# Patient Record
Sex: Male | Born: 1935 | Race: White | Hispanic: No | Marital: Married | Smoking: Former smoker
Health system: Southern US, Community
[De-identification: ages and names within clinical notes are randomized; demographics above are authoritative.]

## PROBLEM LIST (undated history)

## (undated) DIAGNOSIS — I10 Essential (primary) hypertension: Secondary | ICD-10-CM

## (undated) DIAGNOSIS — G9001 Carotid sinus syncope: Secondary | ICD-10-CM

## (undated) DIAGNOSIS — E785 Hyperlipidemia, unspecified: Secondary | ICD-10-CM

## (undated) DIAGNOSIS — F0390 Unspecified dementia without behavioral disturbance: Secondary | ICD-10-CM

## (undated) DIAGNOSIS — I495 Sick sinus syndrome: Secondary | ICD-10-CM

## (undated) DIAGNOSIS — Z950 Presence of cardiac pacemaker: Secondary | ICD-10-CM

## (undated) HISTORY — DX: Hyperlipidemia, unspecified: E78.5

## (undated) HISTORY — DX: Sick sinus syndrome: I49.5

## (undated) HISTORY — PX: INGUINAL HERNIA REPAIR: SUR1180

## (undated) HISTORY — DX: Unspecified dementia, unspecified severity, without behavioral disturbance, psychotic disturbance, mood disturbance, and anxiety: F03.90

## (undated) HISTORY — DX: Presence of cardiac pacemaker: Z95.0

## (undated) HISTORY — DX: Carotid sinus syncope: G90.01

## (undated) HISTORY — DX: Essential (primary) hypertension: I10

---

## 2002-01-02 ENCOUNTER — Encounter: Payer: Self-pay | Admitting: Cardiology

## 2002-01-02 ENCOUNTER — Inpatient Hospital Stay (HOSPITAL_COMMUNITY): Admission: EM | Admit: 2002-01-02 | Discharge: 2002-01-04 | Payer: Self-pay | Admitting: Cardiology

## 2002-01-03 HISTORY — PX: PACEMAKER INSERTION: SHX728

## 2002-01-04 ENCOUNTER — Encounter: Payer: Self-pay | Admitting: Internal Medicine

## 2002-01-04 ENCOUNTER — Encounter: Payer: Self-pay | Admitting: Cardiology

## 2002-08-18 ENCOUNTER — Encounter: Payer: Self-pay | Admitting: Cardiology

## 2004-03-14 ENCOUNTER — Ambulatory Visit: Payer: Self-pay

## 2004-09-10 ENCOUNTER — Ambulatory Visit: Payer: Self-pay | Admitting: Internal Medicine

## 2004-12-11 ENCOUNTER — Ambulatory Visit: Payer: Self-pay | Admitting: Cardiology

## 2005-03-13 ENCOUNTER — Ambulatory Visit: Payer: Self-pay | Admitting: Internal Medicine

## 2005-04-16 ENCOUNTER — Ambulatory Visit: Payer: Self-pay | Admitting: Internal Medicine

## 2005-05-19 ENCOUNTER — Ambulatory Visit: Payer: Self-pay | Admitting: Internal Medicine

## 2005-06-19 ENCOUNTER — Ambulatory Visit: Payer: Self-pay | Admitting: Internal Medicine

## 2005-07-22 ENCOUNTER — Ambulatory Visit: Payer: Self-pay | Admitting: Internal Medicine

## 2005-09-01 ENCOUNTER — Ambulatory Visit: Payer: Self-pay | Admitting: Internal Medicine

## 2005-10-05 ENCOUNTER — Ambulatory Visit: Payer: Self-pay | Admitting: Internal Medicine

## 2005-11-10 ENCOUNTER — Ambulatory Visit: Payer: Self-pay | Admitting: Internal Medicine

## 2005-12-04 ENCOUNTER — Ambulatory Visit: Payer: Self-pay | Admitting: Internal Medicine

## 2006-02-11 ENCOUNTER — Ambulatory Visit: Payer: Self-pay | Admitting: Internal Medicine

## 2006-04-08 ENCOUNTER — Ambulatory Visit: Payer: Self-pay | Admitting: Internal Medicine

## 2006-05-06 ENCOUNTER — Ambulatory Visit: Payer: Self-pay | Admitting: Internal Medicine

## 2006-06-03 ENCOUNTER — Ambulatory Visit: Payer: Self-pay | Admitting: Internal Medicine

## 2006-07-01 ENCOUNTER — Ambulatory Visit: Payer: Self-pay | Admitting: Internal Medicine

## 2006-07-29 ENCOUNTER — Ambulatory Visit: Payer: Self-pay | Admitting: Internal Medicine

## 2006-08-26 ENCOUNTER — Ambulatory Visit: Payer: Self-pay | Admitting: Internal Medicine

## 2006-09-23 ENCOUNTER — Ambulatory Visit: Payer: Self-pay | Admitting: Internal Medicine

## 2006-10-21 ENCOUNTER — Ambulatory Visit: Payer: Self-pay | Admitting: Internal Medicine

## 2006-11-18 ENCOUNTER — Ambulatory Visit: Payer: Self-pay | Admitting: Internal Medicine

## 2006-12-16 ENCOUNTER — Ambulatory Visit: Payer: Self-pay | Admitting: Internal Medicine

## 2007-01-13 ENCOUNTER — Ambulatory Visit: Payer: Self-pay | Admitting: Internal Medicine

## 2007-01-31 ENCOUNTER — Ambulatory Visit: Payer: Self-pay | Admitting: Internal Medicine

## 2007-02-10 ENCOUNTER — Ambulatory Visit: Payer: Self-pay | Admitting: Internal Medicine

## 2007-03-10 ENCOUNTER — Ambulatory Visit: Payer: Self-pay | Admitting: Internal Medicine

## 2007-04-07 ENCOUNTER — Ambulatory Visit: Payer: Self-pay | Admitting: Internal Medicine

## 2007-05-05 ENCOUNTER — Ambulatory Visit: Payer: Self-pay | Admitting: Internal Medicine

## 2007-08-04 ENCOUNTER — Ambulatory Visit: Payer: Self-pay | Admitting: Internal Medicine

## 2007-10-06 ENCOUNTER — Ambulatory Visit: Payer: Self-pay | Admitting: Internal Medicine

## 2007-11-03 ENCOUNTER — Ambulatory Visit: Payer: Self-pay | Admitting: Internal Medicine

## 2008-02-02 ENCOUNTER — Ambulatory Visit: Payer: Self-pay | Admitting: Internal Medicine

## 2008-05-03 ENCOUNTER — Ambulatory Visit: Payer: Self-pay | Admitting: Internal Medicine

## 2008-05-11 ENCOUNTER — Ambulatory Visit: Payer: Self-pay | Admitting: Internal Medicine

## 2008-05-14 ENCOUNTER — Encounter: Payer: Self-pay | Admitting: Internal Medicine

## 2008-09-03 ENCOUNTER — Ambulatory Visit: Payer: Self-pay | Admitting: Internal Medicine

## 2008-12-03 ENCOUNTER — Encounter: Payer: Self-pay | Admitting: Internal Medicine

## 2009-01-04 ENCOUNTER — Ambulatory Visit: Payer: Self-pay | Admitting: Internal Medicine

## 2009-01-07 DIAGNOSIS — G9001 Carotid sinus syncope: Secondary | ICD-10-CM

## 2009-01-07 DIAGNOSIS — R55 Syncope and collapse: Secondary | ICD-10-CM

## 2009-03-04 ENCOUNTER — Ambulatory Visit: Payer: Self-pay | Admitting: Internal Medicine

## 2009-04-10 ENCOUNTER — Encounter: Payer: Self-pay | Admitting: Cardiology

## 2009-04-16 ENCOUNTER — Ambulatory Visit: Payer: Self-pay | Admitting: Cardiology

## 2009-04-16 ENCOUNTER — Encounter: Payer: Self-pay | Admitting: Cardiology

## 2009-04-29 ENCOUNTER — Ambulatory Visit: Payer: Self-pay | Admitting: Cardiology

## 2009-04-29 ENCOUNTER — Encounter (INDEPENDENT_AMBULATORY_CARE_PROVIDER_SITE_OTHER): Payer: Self-pay | Admitting: *Deleted

## 2009-04-29 DIAGNOSIS — R9439 Abnormal result of other cardiovascular function study: Secondary | ICD-10-CM

## 2009-04-30 ENCOUNTER — Encounter: Payer: Self-pay | Admitting: Cardiology

## 2009-05-03 ENCOUNTER — Ambulatory Visit: Payer: Self-pay | Admitting: Cardiology

## 2009-05-03 ENCOUNTER — Inpatient Hospital Stay (HOSPITAL_BASED_OUTPATIENT_CLINIC_OR_DEPARTMENT_OTHER): Admission: RE | Admit: 2009-05-03 | Discharge: 2009-05-03 | Payer: Self-pay | Admitting: Cardiology

## 2009-05-16 ENCOUNTER — Ambulatory Visit: Payer: Self-pay | Admitting: Cardiology

## 2009-06-03 ENCOUNTER — Ambulatory Visit: Payer: Self-pay | Admitting: Internal Medicine

## 2009-09-13 ENCOUNTER — Ambulatory Visit: Payer: Self-pay | Admitting: Internal Medicine

## 2009-10-04 ENCOUNTER — Ambulatory Visit: Payer: Self-pay | Admitting: Internal Medicine

## 2010-01-03 ENCOUNTER — Ambulatory Visit: Payer: Self-pay | Admitting: Internal Medicine

## 2010-02-14 ENCOUNTER — Encounter (INDEPENDENT_AMBULATORY_CARE_PROVIDER_SITE_OTHER): Payer: Self-pay | Admitting: *Deleted

## 2010-02-26 ENCOUNTER — Encounter: Payer: Self-pay | Admitting: Internal Medicine

## 2010-04-04 ENCOUNTER — Ambulatory Visit: Payer: Self-pay | Admitting: Internal Medicine

## 2010-04-04 ENCOUNTER — Encounter: Payer: Self-pay | Admitting: Internal Medicine

## 2010-05-12 ENCOUNTER — Ambulatory Visit
Admission: RE | Admit: 2010-05-12 | Discharge: 2010-05-12 | Payer: Self-pay | Source: Home / Self Care | Attending: Internal Medicine | Admitting: Internal Medicine

## 2010-05-12 ENCOUNTER — Encounter: Payer: Self-pay | Admitting: Internal Medicine

## 2010-05-20 NOTE — Assessment & Plan Note (Signed)
Summary: f/u PPM/reminder received LA   Visit Type:  Pacemaker check Primary Provider:  Dr. Donzetta Sprung   History of Present Illness: The patient presents today for routine electrophysiology followup. He reports doing very well since last being seen in our clinic. 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  He has had progressive sinus node dysfunction and now required frequent atrial pacing.  The patient denies symptoms of palpitations, chest pain, shortness of breath, orthopnea, PND, lower extremity edema, dizziness, presyncope, syncope, or neurologic sequela. The patient is tolerating medications without difficulties and is otherwise without complaint today.   Preventive Screening-Counseling & Management  Alcohol-Tobacco     Smoking Status: quit     Year Quit: 1985  Current Medications (verified): 1)  Namenda 10 Mg Tabs (Memantine Hcl) .... Take 1 Tablet By Mouth Two Times A Day 2)  Aricept 10 Mg Tabs (Donepezil Hcl) .... Take 1 Tablet By Mouth Once A Day 3)  Colchicine 0.6 Mg Tabs (Colchicine) .... Take 1 Tablet By Mouth Two Times A Day 4)  Atenolol 25 Mg Tabs (Atenolol) .... Take 1 Tablet By Mouth Once A Day 5)  Simvastatin 40 Mg Tabs (Simvastatin) .... 1/2 Tab At Bedtime 6)  Terazosin Hcl 10 Mg Caps (Terazosin Hcl) .... Take 1 Tablet By Mouth Once A Day 7)  Hydrochlorothiazide 25 Mg Tabs (Hydrochlorothiazide) .... Take One Tablet By Mouth Daily. 8)  Aspir-Low 81 Mg Tbec (Aspirin) .... Take 1 Tablet By Mouth Once A Day  Allergies (verified): No Known Drug Allergies  Comments:  Nurse/Medical Assistant: The patient's medications and allergies were reviewed with the patient and were updated in the Medication and Allergy Lists. List reviewed.  Past History:  Past Medical History: SINOATRIAL NODE DYSFUNCTION (ICD-427.81) PACEMAKER (ICD-V45.Marland Kitchen01) CAROTID SINUS SYNDROME (ICD-337.01)  Past Surgical History: Reviewed history from 04/29/2009  and no changes required. Pacemaker 01/03/02  Medtronic Kappa [id number] Right inguinal hernia repair Nephrolithiasis  Social History: Reviewed history from 04/29/2009 and no changes required. Married  Alcohol Use - no Drug Use - no Quit tobacco 1950sSmoking Status:  quit  Vital Signs:  Patient profile:   77 year old male Height:      69 inches Weight:      179 pounds Pulse rate:   83 / minute BP sitting:   123 / 80  (left arm) Cuff size:   regular  Vitals Entered By: Carlye Grippe (Sep 13, 2009 3:50 PM)  Physical Exam  General:  Well developed, well nourished, in no acute distress. Head:  normocephalic and atraumatic Eyes:  PERRLA/EOM intact; conjunctiva and lids normal. Mouth:  Teeth, gums and palate normal. Oral mucosa normal. Neck:  supple Chest Wall:  R sided pacemaker pocket is well healed Lungs:  Clear bilaterally to auscultation and percussion. Heart:  Non-displaced PMI, chest non-tender; regular rate and rhythm, S1, S2 without murmurs, rubs or gallops. Carotid upstroke normal, no bruit. Normal abdominal aortic size, no bruits. Femorals normal pulses, no bruits. Pedals normal pulses. No edema, no varicosities. Abdomen:  Bowel sounds positive; abdomen soft and non-tender without masses, organomegaly, or hernias noted. No hepatosplenomegaly. Msk:  Back normal, normal gait. Muscle strength and tone normal. Pulses:  pulses normal in all 4 extremities Extremities:  No clubbing or cyanosis. Neurologic:  Alert and oriented x 3.   PPM Specifications Following MD:  Hillis Range, MD     PPM Vendor:  Medtronic     PPM Model Number:  [id number]  PPM Serial Number:  [id number] PPM DOI:  01/03/2002     PPM Implanting MD:  Sherryl Manges, MD  Lead 1    Location: RA     DOI: 01/03/2002     Model #: [id number]     Serial #: [id number]     Status: active Lead 2    Location: RV     DOI: 01/03/2002     Model #: [id number]     Serial #: [id number]     Status: active  Magnet Response Rate:  BOL 85 ERI  65  Indications:  SYNCOPE WITH BRADYCARDIA   PPM Follow Up Remote Check?  No Battery Voltage:  2.67 V     Battery Est. Longevity:  15 MONTHS     Pacer Dependent:  Yes       PPM Device Measurements Atrium  Amplitude: PACED AT 30 mV, Impedance: 497 ohms, Threshold: 1.0 V at 0.4 msec Right Ventricle  Amplitude: 11.2 mV, Impedance: 597 ohms, Threshold: 1.5 V at 0.4 msec  Episodes MS Episodes:  0     Ventricular High Rate:  0     Atrial Pacing:  99.4%     Ventricular Pacing:  5.9%  Parameters Mode:  DDDR     Lower Rate Limit:  60     Upper Rate Limit:  130 Paced AV Delay:  300     Sensed AV Delay:  270 Next Cardiology Appt Due:  02/18/2010 Tech Comments:  Normal device function.  No changes made today.  Will change TTM's to monthly. ROV 6 months clinic. Gypsy Balsam RN BSN  Sep 13, 2009 4:06 PM  MD Comments:  agree  Impression & Recommendations:  Problem # 1:  SINOATRIAL NODE DYSFUNCTION (ICD-427.81) normal pacemaker function will follow with monthly TTMs as ERI is approaching return to clinic in 6 months

## 2010-05-20 NOTE — Assessment & Plan Note (Signed)
Summary: NP6-CHEST TIGHTNESS  ABNORMAL CARD/STRESS TEST   Visit Type:  Initial Consult Primary Provider:  Dr. Reuel Boom   History of Present Illness: The pateint presents for evaluation of chest pain, dyspnea and fatigue.  He has a memory disorder which makes this evaluation slightly more difficult. However, his wife and daughter fill in the details. They report that a few weeks ago he had chest discomfort while walking at church. He felt somewhat diaphoretic. He has had over the past several weeks increasing fatigue and decreased exercise tolerance. He does rub his chest but it is near a previous pacemaker. He does not verbalize for himself significant chest pressure neck ordered arm discomfort. However, there's been a clear decline in his functional status secondary to fatigue, dyspnea with exertion and vague chest discomfort. He's also had some dizziness. He is functional and very pleasantly confused. He did have a stress perfusion study. He was unable to reach his target heart rate and was converted to a dobutamine perfusion study. His ejection fraction was 54% with inferior scar and mild ischemia.  Preventive Screening-Counseling & Management  Alcohol-Tobacco     Smoking Status: quit     Year Quit: 30 - 40 years  Current Medications (verified): 1)  Namenda 10 Mg Tabs (Memantine Hcl) .... Take 1 Tablet By Mouth Two Times A Day 2)  Aricept 10 Mg Tabs (Donepezil Hcl) .... Take 1 Tablet By Mouth Once A Day 3)  Ranitidine Hcl 150 Mg Caps (Ranitidine Hcl) .... Take 1 Tablet By Mouth Two Times A Day 4)  Colchicine 0.6 Mg Tabs (Colchicine) .... Take 1 Tablet By Mouth Two Times A Day 5)  Atenolol 25 Mg Tabs (Atenolol) .... Take 1 Tablet By Mouth Once A Day 6)  Simvastatin 40 Mg Tabs (Simvastatin) .... 1/2 Tab At Bedtime 7)  Terazosin Hcl 10 Mg Caps (Terazosin Hcl) .... Take 1 Tablet By Mouth Once A Day 8)  Hydrochlorothiazide 25 Mg Tabs (Hydrochlorothiazide) .... Take One Tablet By Mouth Daily. 9)   Aspir-Low 81 Mg Tbec (Aspirin) .... Take 1 Tablet By Mouth Once A Day  Allergies (verified): No Known Drug Allergies  Past History:  Past Medical History: SINOATRIAL NODE DYSFUNCTION (ICD-427.81) PACEMAKER (ICD-V45.Marland Kitchen01) SYNCOPE AND COLLAPSE (ICD-780.2) CAROTID SINUS SYNDROME (ICD-337.01)  Past Surgical History: Pacemaker 01/03/02  Medtronic Kappa [id number] Right inguinal hernia repair Nephrolithiasis  Family History: Father with MI age 64  Social History: Married  Alcohol Use - no Drug Use - no Quit tobacco 1950sSmoking Status:  quit  Review of Systems       As stated in the HPI and negative for all other systems.   Vital Signs:  Patient profile:   77 year old male Height:      70 inches Weight:      178 pounds BMI:     25.63 Pulse rate:   62 / minute BP sitting:   117 / 72  (left arm) Cuff size:   regular  Vitals Entered By: Hoover Brunette, LPN (April 29, 2009 2:03 PM)  Nutrition Counseling: Patient's BMI is greater than 25 and therefore counseled on weight management options. Is Patient Diabetic? No Comments chest tightness, abnormal stress test   Physical Exam  General:  Well developed, well nourished, in no acute distress. Head:  normocephalic and atraumatic Eyes:  PERRLA/EOM intact; conjunctiva and lids normal. Mouth:  edentulous, gums and palate normal. Oral mucosa normal. Neck:  Neck supple, no JVD. No masses, thyromegaly or abnormal cervical nodes. Chest Wall:  well-healed  sternotomy scar Lungs:  Clear bilaterally to auscultation and percussion. Abdomen:  Bowel sounds positive; abdomen soft and non-tender without masses, organomegaly, or hernias noted. No hepatosplenomegaly. Msk:  Back normal, normal gait. Muscle strength and tone normal. Extremities:  No clubbing or cyanosis. Neurologic:  Alert and oriented x 3. Skin:  Intact without lesions or rashes. Cervical Nodes:  no significant adenopathy Axillary Nodes:  no significant adenopathy Inguinal  Nodes:  no significant adenopathy Psych:  Normal affect.   Detailed Cardiovascular Exam  Neck    Carotids: Carotids full and equal bilaterally without bruits.      Neck Veins: Normal, no JVD.    Heart    Inspection: no deformities or lifts noted.      Palpation: normal PMI with no thrills palpable.      Auscultation: regular rate and rhythm, S1, S2 without murmurs, rubs, gallops, or clicks.    Vascular    Abdominal Aorta: no palpable masses, pulsations, or audible bruits.      Femoral Pulses: normal femoral pulses bilaterally.      Pedal Pulses: normal pedal pulses bilaterally.      Radial Pulses: normal radial pulses bilaterally.      Peripheral Circulation: no clubbing, cyanosis, or edema noted with normal capillary refill.     EKG  Procedure date:  04/16/2009  Findings:      sinus rhythm, right axis deviation, RSR prime V1, nonspecific T-wave flattening, inferior lead V3 T wave inversion  PPM Specifications Following MD:  Sherryl Manges, MD     PPM Vendor:  Medtronic     PPM Model Number:  [id number]     PPM Serial Number:  [id number] PPM DOI:  01/03/2002     PPM Implanting MD:  Sherryl Manges, MD  Lead 1    Location: RA     DOI: 01/03/2002     Model #: [id number]     Serial #: [id number]     Status: active Lead 2    Location: RV     DOI: 01/03/2002     Model #: [id number]     Serial #: [id number]     Status: active  Magnet Response Rate:  BOL 85 ERI 65  Indications:  SYNCOPE WITH BRADYCARDIA   PPM Follow Up Pacer Dependent:  Yes      Parameters Mode:  DDDR     Lower Rate Limit:  60     Upper Rate Limit:  130 Paced AV Delay:  300     Sensed AV Delay:  270  Impression & Recommendations:  Problem # 1:  MYOCARDIAL PERFUSION SCAN, WITH STRESS TEST, ABNORMAL (ICD-794.39) The patient had an abnormal stress test as described. He has symptoms that could be progressive angina (unstable). Therefore, cardiac catheterization is indicated. I discussed with the patient the risks benefits  including stroke, heart attack, death, bleeding, bruising, vascular trauma, emboli and other. His family clearly understands and agrees to proceed. He has no objection.  Problem # 2:  PACEMAKER (ICD-V45.Marland Kitchen01) He his probably about due for pacemaker checkup. I will see when this is due and arrange this as needed.  Problem # 3:  DIZZINESS (ICD-780.4) The patient will have evaluation as above. I will also check orthostatic readings though it does not sound like this is the cause.  Other Orders: Cardiac Catheterization (Cardiac Cath) T-Basic Metabolic Panel [phone]) T-CBC No Diff ([id number]) T-Protime, Auto ([id number]) T-PTT ([id number]) T-Chest x-ray, 2 views (18841)  Patient Instructions: 1)  Your physician has requested that you  have a cardiac catheterization.  Cardiac catheterization is used to diagnose and/or treat various heart conditions. Doctors may recommend this procedure for a number of different reasons. The most common reason is to evaluate chest pain. Chest pain can be a symptom of coronary artery disease (CAD), and cardiac catheterization can show whether plaque is narrowing or blocking your heart's arteries. This procedure is also used to evaluate the valves, as well as measure the blood flow and oxygen levels in different parts of your heart.  For further information please visit [email or web address]  Please follow instruction sheet, as given.

## 2010-05-20 NOTE — Cardiovascular Report (Signed)
Summary: TTM   TTM   Imported By: Roderic Ovens 01/29/2010 16:12:06  _____________________________________________________________________  External Attachment:    Type:   Image     Comment:   External Document

## 2010-05-20 NOTE — Cardiovascular Report (Signed)
Summary: TTM   TTM   Imported By: Roderic Ovens 06/20/2009 16:08:42  _____________________________________________________________________  External Attachment:    Type:   Image     Comment:   External Document

## 2010-05-20 NOTE — Letter (Signed)
Summary: Internal Other/ PATIENT HISTORY FORM  Internal Other/ PATIENT HISTORY FORM   Imported By: Dorise Hiss 04/29/2009 15:56:32  _____________________________________________________________________  External Attachment:    Type:   Image     Comment:   External Document

## 2010-05-20 NOTE — Letter (Signed)
Summary: Discharge Summary  Discharge Summary   Imported By: Dorise Hiss 04/29/2009 15:58:56  _____________________________________________________________________  External Attachment:    Type:   Image     Comment:   External Document

## 2010-05-20 NOTE — Cardiovascular Report (Signed)
Summary: TTM   TTM   Imported By: Roderic Ovens 10/28/2009 14:52:34  _____________________________________________________________________  External Attachment:    Type:   Image     Comment:   External Document

## 2010-05-20 NOTE — Letter (Signed)
Summary: External Correspondence/ OFFICE VISIT DR. DANIEL  External Correspondence/ OFFICE VISIT DR. DANIEL   Imported By: Dorise Hiss 04/29/2009 10:51:14  _____________________________________________________________________  External Attachment:    Type:   Image     Comment:   External Document

## 2010-05-20 NOTE — Miscellaneous (Signed)
Summary: dx correction  Clinical Lists Changes  Problems: Changed problem from PACEMAKER (ICD-V45..01) to PACEMAKER, PERMANENT (ICD-V45.01)  changed the incorrect dx code to correct dx code 

## 2010-05-20 NOTE — Letter (Signed)
Summary: External Correspondence/ FAXED PRE-CATH ORDER  External Correspondence/ FAXED PRE-CATH ORDER   Imported By: Dorise Hiss 04/30/2009 14:27:09  _____________________________________________________________________  External Attachment:    Type:   Image     Comment:   External Document

## 2010-05-20 NOTE — Assessment & Plan Note (Signed)
Summary: EPH-POST CATH -S[facility]   Visit Type:  Follow-up Primary Provider:  Dr. Donzetta Sprung  CC:  Chest pain/dyspnea.  History of Present Illness: The patient presents for evaluation of the above. Because of the slightly abnormal stress test and these complaints I did perform a cardiac catheterization. This demonstrated a well preserved ejection fraction and minimal coronary plaque. Since that time he has had no new complaints. He has had no shortness of breath, PND or orthopnea there is has been no further chest discomfort. He had no problems with his catheterization site.  Preventive Screening-Counseling & Management  Alcohol-Tobacco     Smoking Status: quit > 6 months  Comments: Pt smoked for about 10 yrs and quit about 30 yrs.  Current Medications (verified): 1)  Namenda 10 Mg Tabs (Memantine Hcl) .... Take 1 Tablet By Mouth Two Times A Day 2)  Aricept 10 Mg Tabs (Donepezil Hcl) .... Take 1 Tablet By Mouth Once A Day 3)  Ranitidine Hcl 150 Mg Caps (Ranitidine Hcl) .... Take 1 Tablet By Mouth Two Times A Day 4)  Colchicine 0.6 Mg Tabs (Colchicine) .... Take 1 Tablet By Mouth Two Times A Day 5)  Atenolol 25 Mg Tabs (Atenolol) .... Take 1 Tablet By Mouth Once A Day 6)  Simvastatin 40 Mg Tabs (Simvastatin) .... 1/2 Tab At Bedtime 7)  Terazosin Hcl 10 Mg Caps (Terazosin Hcl) .... Take 1 Tablet By Mouth Once A Day 8)  Hydrochlorothiazide 25 Mg Tabs (Hydrochlorothiazide) .... Take One Tablet By Mouth Daily. 9)  Aspir-Low 81 Mg Tbec (Aspirin) .... Take 1 Tablet By Mouth Once A Day  Allergies: No Known Drug Allergies  Comments:  Nurse/Medical Assistant: The patient's medications were reviewed with the patient and were updated in the Medication List. Pt brought a list of medications to office visit.  Cyril Loosen, RN, BSN (May 16, 2009 11:01 AM)  Past History:  Past Medical History: Reviewed history from 04/29/2009 and no changes required. SINOATRIAL NODE DYSFUNCTION  (ICD-427.81) PACEMAKER (ICD-V45.Marland Kitchen01) SYNCOPE AND COLLAPSE (ICD-780.2) CAROTID SINUS SYNDROME (ICD-337.01)  Past Surgical History: Reviewed history from 04/29/2009 and no changes required. Pacemaker 01/03/02  Medtronic Kappa [id number] Right inguinal hernia repair Nephrolithiasis  Social History: Smoking Status:  quit > 6 months  Review of Systems       As stated in the HPI and negative for all other systems.  Vital Signs:  Patient profile:   77 year old male Height:      69 inches Weight:      174.75 pounds Pulse rate:   68 / minute BP sitting:   132 / 75  (left arm) Cuff size:   regular  Vitals Entered By: Cyril Loosen, RN, BSN (May 16, 2009 10:56 AM) CC: Chest pain/dyspnea Comments Post cath follow up. No problems.   Physical Exam  General:  Well developed, well nourished, in no acute distress. Head:  normocephalic and atraumatic Eyes:  PERRLA/EOM intact; conjunctiva and lids normal. Neck:  Neck supple, no JVD. No masses, thyromegaly or abnormal cervical nodes. Chest Wall:  no deformities or breast masses noted, well healed right upper pacemaker pocket Lungs:  Clear bilaterally to auscultation and percussion. Heart:  Non-displaced PMI, chest non-tender; regular rate and rhythm, S1, S2 without murmurs, rubs or gallops. Carotid upstroke normal, no bruit. Normal abdominal aortic size, no bruits. Femorals normal pulses, no bruits. Pedals normal pulses. No edema, no varicosities. Abdomen:  Bowel sounds positive; abdomen soft and non-tender without masses, organomegaly, or hernias noted. No hepatosplenomegaly.  Msk:  Back normal, normal gait. Muscle strength and tone normal. Extremities:  No clubbing or cyanosis. Neurologic:  Alert and oriented x 3. Skin:  Intact without lesions or rashes. Psych:  Normal affect. (poor short-term memory)   PPM Specifications Following MD:  Sherryl Manges, MD     PPM Vendor:  Medtronic     PPM Model Number:  [phone]     PPM Serial Number:   [id number] PPM DOI:  01/03/2002     PPM Implanting MD:  Sherryl Manges, MD  Lead 1    Location: RA     DOI: 01/03/2002     Model #: [id number]     Serial #: [id number]     Status: active Lead 2    Location: RV     DOI: 01/03/2002     Model #: [id number]     Serial #: [id number]     Status: active  Magnet Response Rate:  BOL 85 ERI 65  Indications:  SYNCOPE WITH BRADYCARDIA   PPM Follow Up Pacer Dependent:  Yes      Parameters Mode:  DDDR     Lower Rate Limit:  60     Upper Rate Limit:  130 Paced AV Delay:  300     Sensed AV Delay:  270  Impression & Recommendations:  Problem # 1:  DYSPNEA (ICD-786.05) The patient was predominantly describing some fatigue with some mild dyspnea and chest discomfort. However, given the results of the catheterization there is no clear cardiac etiology to this. No further workup is suggested  Problem # 2:  SINOATRIAL NODE DYSFUNCTION (ICD-427.81) He is status post pacemaker and will continue followup with our EP clinic.  Patient Instructions: 1)  Your physician recommends that you continue on your current medications as directed. Please refer to the Current Medication list given to you today. 2)  Keep follow up with EP. Reminder confirmed for March/2011.

## 2010-05-20 NOTE — Letter (Signed)
Summary: Cardiac Cath Instructions - [facility]  [facility] at [facility] [address]   [place], [place] [address]   Phone: [phone]  Fax: [phone]     04/29/2009 MRN: [id number]  Darin Thompson [address] [place], [place]  [address]  Dear Darin Thompson,   You are scheduled for a Cardiac Catheterization on Friday 05/03/09 @1 :30pm with Dr.Hochrein.  Please arrive to the 1st floor of the Heart and Vascular Center at [facility] at 12:30pm on the day of your procedure. Please do not arrive before 6:30 a.m. Call the Heart and Vascular Center at [phone] if you are unable to make your appointmnet. The Code to get into the parking garage under the building is 0090. Take the elevators to the 1st floor. You must have someone to drive you home. Someone must be with you for the first 24 hours after you arrive home. Please wear clothes that are easy to get on and off and wear slip-on shoes. Do not eat or drink after midnight except water with your medications that morning. Bring all your medications and current insurance cards with you.  ___ DO NOT take these medications before your procedure:  _X__ Make sure you take your aspirin 325mg .  _X__ You may take ALL of your medications with water that morning.  ___ DO NOT take ANY medications before your procedure.  ___ Pre-med instructions:  ________________________________________________________________________  The usual length of stay after your procedure is 2 to 3 hours. This can vary.  If you have any questions, please call the office at the number listed above.  Carlye Grippe    Directions to the [employer] and Vascular Center [facility]  Please Note : Park in [place] under the building not the parking deck.  From [employer]: Turn onto [address] Left onto [place] (1st stoplight) Right at the brick entrance to the hospital ([address]) Bear to the right and you will  see a blue sign "Heart and Vascular Center" Parking garage is a sharp right'to get through the gate out in the code __0900_____. Once you park, take the elevator to the first floor. Please do not arrive before 0630am. The building will be dark before that time.   From [address] Turn onto [address] Turn left into the brick entrance to the hospital ([address] circle [address]) Bear to the right and you will see a blue sign "[facility] Center" Parking garage is a sharp right, to get thru the gate put in the code _0900___. Once you park, take the elevator to the first floor. Please do not arrive before 0630am. The building will be dark before that time

## 2010-05-20 NOTE — Cardiovascular Report (Signed)
Summary: Card Device Clinic/ INTERROGATION REPORT  Card Device Clinic/ INTERROGATION REPORT   Imported By: Dorise Hiss 09/19/2009 11:14:45  _____________________________________________________________________  External Attachment:    Type:   Image     Comment:   External Document

## 2010-05-22 NOTE — Assessment & Plan Note (Signed)
Summary: 6 MO FU PER NOV REMINDER-SRS   Visit Type:  Pacemaker check Primary Provider:  Dr. Donzetta Sprung   History of Present Illness: The patient presents today for routine electrophysiology followup. He reports doing very well since last being seen in our clinic.  He has had a dry cough and wheeze for several days but denies fevers, chills, or SOB.  The patient denies symptoms of palpitations, chest pain,  orthopnea, PND, lower extremity edema, dizziness, presyncope, syncope, or neurologic sequela. The patient is tolerating medications without difficulties and is otherwise without complaint today.   Preventive Screening-Counseling & Management  Alcohol-Tobacco     Smoking Status: quit  Current Medications (verified): 1)  Namenda 10 Mg Tabs (Memantine Hcl) .... Take 1 Tablet By Mouth Two Times A Day 2)  Aricept 10 Mg Tabs (Donepezil Hcl) .... Take 1 Tablet By Mouth Once A Day 3)  Colchicine 0.6 Mg Tabs (Colchicine) .... Take 1 Tablet By Mouth Two Times A Day 4)  Atenolol 25 Mg Tabs (Atenolol) .... Take 1 Tablet By Mouth Once A Day 5)  Simvastatin 40 Mg Tabs (Simvastatin) .... 1/2 Tab At Bedtime 6)  Terazosin Hcl 10 Mg Caps (Terazosin Hcl) .... Take 1 Tablet By Mouth Once A Day 7)  Hydrochlorothiazide 25 Mg Tabs (Hydrochlorothiazide) .... Take One Tablet By Mouth Daily. 8)  Aspir-Low 81 Mg Tbec (Aspirin) .... Take 1 Tablet By Mouth Once A Day  Allergies (verified): No Known Drug Allergies  Comments:  Nurse/Medical Assistant: The patient's medications and allergies were reviewed with the patient and were updated in the Medication and Allergy Lists. Tammi Romine CMA (May 12, 2010 3:22 PM)  Past History:  Past Medical History: SINOATRIAL NODE DYSFUNCTION (ICD-427.81) PACEMAKER (ICD-V45.Marland Kitchen01) CAROTID SINUS SYNDROME (ICD-337.01) Dementia Gout HL HTN  Past Surgical History: Reviewed history from 04/29/2009 and no changes required. Pacemaker 01/03/02  Medtronic Kappa  [id number] Right inguinal hernia repair Nephrolithiasis  Social History: Reviewed history from 04/29/2009 and no changes required. Married  Alcohol Use - no Drug Use - no Quit tobacco 1950s  Vital Signs:  Patient profile:   77 year old male Height:      69 inches Weight:      177 pounds BMI:     26.23 Pulse rate:   61 / minute BP sitting:   114 / 73  (right arm) Cuff size:   regular  Vitals Entered By: Fuller Plan CMA (May 12, 2010 3:22 PM)  Physical Exam  General:  Well developed, well nourished, in no acute distress. Head:  normocephalic and atraumatic Eyes:  PERRLA/EOM intact; conjunctiva and lids normal. Mouth:  Teeth, gums and palate normal. Oral mucosa normal. Neck:  supple Chest Wall:  R sided PPM well healed Lungs:  diffuse expiratory wheezes, nl WOB Heart:  RRR (paced), no m/r/g Abdomen:  Bowel sounds positive; abdomen soft and non-tender without masses, organomegaly, or hernias noted. No hepatosplenomegaly. Msk:  Back normal, normal gait. Muscle strength and tone normal. Extremities:  No clubbing or cyanosis. Neurologic:  Alert and oriented x 3. Skin:  Intact without lesions or rashes.   PPM Specifications Following MD:  Hillis Range, MD     PPM Vendor:  Medtronic     PPM Model Number:  [id number]     PPM Serial Number:  [id number] PPM DOI:  01/03/2002     PPM Implanting MD:  Sherryl Manges, MD  Lead 1    Location: RA     DOI: 01/03/2002     Model #:  [id number]     Serial #: [id number]     Status: active Lead 2    Location: RV     DOI: 01/03/2002     Model #: [id number]     Serial #: [id number]     Status: active  Magnet Response Rate:  BOL 85 ERI 65  Indications:  SYNCOPE WITH BRADYCARDIA   PPM Follow Up Battery Voltage:  2.58 V     Battery Est. Longevity:  3 mths     Pacer Dependent:  Yes       PPM Device Measurements Atrium  Amplitude: PACED mV, Impedance: 527 ohms, Threshold: 0.750 V at 0.40 msec Right Ventricle  Amplitude: 8.00 mV, Impedance: 592 ohms,  Threshold: 1.750 V at 0.40 msec  Episodes MS Episodes:  0     Ventricular High Rate:  0     Atrial Pacing:  98.6%     Ventricular Pacing:  97.5%  Parameters Mode:  DDDR     Lower Rate Limit:  60     Upper Rate Limit:  130 Paced AV Delay:  300     Sensed AV Delay:  270 Tech Comments:  BATTERY LONGEVITY 3 MTHS---MONTHLY TTMs THRU MEDNET.  NORMAL DEVICE FUNCTION.  RV THRESHOLD 1.750 @ 0.40 --INCREASED RV OUTPUT FROM 2.750 TO 3.5 V.  Vella Kohler  May 12, 2010 3:18 PM MD Comments:  agree  Impression & Recommendations:  Problem # 1:  SINOATRIAL NODE DYSFUNCTION (ICD-427.81) normal pacemaker function as above approaching ERI monthly TTMs, return in 3 months

## 2010-05-22 NOTE — Cardiovascular Report (Signed)
Summary: TTM   TTM   Imported By: Roderic Ovens 04/25/2010 14:20:37  _____________________________________________________________________  External Attachment:    Type:   Image     Comment:   External Document

## 2010-05-28 NOTE — Cardiovascular Report (Signed)
Summary: Card Device Clinic/ FINAL REPORT  Card Device Clinic/ FINAL REPORT   Imported By: Dorise Hiss 05/22/2010 16:52:00  _____________________________________________________________________  External Attachment:    Type:   Image     Comment:   External Document

## 2010-06-05 NOTE — Cardiovascular Report (Signed)
Summary: AICD Data Sheet   AICD Data Sheet   Imported By: Roderic Ovens 05/29/2010 09:55:02  _____________________________________________________________________  External Attachment:    Type:   Image     Comment:   External Document

## 2010-07-02 ENCOUNTER — Telehealth: Payer: Self-pay | Admitting: *Deleted

## 2010-07-04 DIAGNOSIS — R55 Syncope and collapse: Secondary | ICD-10-CM

## 2010-07-08 NOTE — Progress Notes (Signed)
Summary: Device Question, also SOB, Ankle Swelling  Phone Note Call from Patient   Summary of Call: Pt's dgt called stating when pt was last seen by Dr. Johney Frame she was told that he had about 3 months left on his device. Pt was suppose to have monthly phone checks but she states no one has called them for these checks. She is concerned about his device.   Pt's dgt also states pt as developed ankle swelling, fatigue and SOB recently. She states she s/w [facility] yesterday and they told her that his primary MD is out of the office and that if pt worsens he should go to ER. Pt was previously evaluated by Dr. Antoine Poche with cath showing mild coronary plaque and normal left ventricular function (EF65%). Notified pt's dgt that primary MD should evaluate or pt should be taken to ER for new symptoms of ankle swelling and SOB. Pt's dgt verbalized understanding.  Pt's dgt also aware that note will be sent to device staff for f/u on monthly phone checks. Initial call taken by: Cyril Loosen, RN, BSN,  July 02, 2010 9:29 AM

## 2010-07-09 ENCOUNTER — Telehealth: Payer: Self-pay | Admitting: *Deleted

## 2010-07-09 NOTE — Telephone Encounter (Signed)
Pt seen by Dr. Reuel Boom for edema (see phone note from 07/02/10) who did CXR and saw fluid on the lungs. He started pt on Lasix. Pt's dgt remains concerned that device battery dying. She states pt is really fatigued and she is concerned that battery dying may have something to do with this. She has device clinic appt scheduled in [place] for 4/11 but would like to be seen sooner or talk with Dr. Johney Frame or Belenda Cruise regarding device. Pt is aware a message will be sent to Dr. Johney Frame and Cloretta Ned.

## 2010-07-09 NOTE — Telephone Encounter (Signed)
Pt daughter states pt has fluid in the lungs and wants to know whats their next steps

## 2010-07-18 NOTE — Telephone Encounter (Signed)
Belenda Cruise has spoke with patient and their family.  They prefer follow-up with Dr Graciela Husbands (whom they know well).  He will see Dr Graciela Husbands 08/06/10

## 2010-07-30 ENCOUNTER — Encounter: Payer: Self-pay | Admitting: *Deleted

## 2010-08-05 ENCOUNTER — Encounter: Payer: Self-pay | Admitting: Internal Medicine

## 2010-08-06 ENCOUNTER — Ambulatory Visit (INDEPENDENT_AMBULATORY_CARE_PROVIDER_SITE_OTHER): Payer: Medicare Other | Admitting: Internal Medicine

## 2010-08-06 ENCOUNTER — Encounter: Payer: Self-pay | Admitting: *Deleted

## 2010-08-06 ENCOUNTER — Encounter: Payer: Self-pay | Admitting: Internal Medicine

## 2010-08-06 VITALS — BP 108/71 | HR 64 | Ht 67.0 in | Wt 177.1 lb

## 2010-08-06 DIAGNOSIS — R55 Syncope and collapse: Secondary | ICD-10-CM

## 2010-08-06 DIAGNOSIS — G9001 Carotid sinus syncope: Secondary | ICD-10-CM

## 2010-08-06 DIAGNOSIS — I495 Sick sinus syndrome: Secondary | ICD-10-CM

## 2010-08-06 DIAGNOSIS — R609 Edema, unspecified: Secondary | ICD-10-CM

## 2010-08-06 DIAGNOSIS — Z01810 Encounter for preprocedural cardiovascular examination: Secondary | ICD-10-CM

## 2010-08-06 DIAGNOSIS — Z950 Presence of cardiac pacemaker: Secondary | ICD-10-CM

## 2010-08-06 LAB — CBC WITH DIFFERENTIAL/PLATELET
Basophils Absolute: 0.1 10*3/uL (ref 0.0–0.1)
Eosinophils Absolute: 0.3 10*3/uL (ref 0.0–0.7)
MCHC: 34.2 g/dL (ref 30.0–36.0)
MCV: 86.8 fl (ref 78.0–100.0)
Monocytes Absolute: 0.8 10*3/uL (ref 0.1–1.0)
Neutrophils Relative %: 60 % (ref 43.0–77.0)
Platelets: 237 10*3/uL (ref 150.0–400.0)
RDW: 14 % (ref 11.5–14.6)

## 2010-08-06 LAB — BASIC METABOLIC PANEL
BUN: 27 mg/dL — ABNORMAL HIGH (ref 6–23)
GFR: 54.29 mL/min — ABNORMAL LOW (ref >60.00)
Potassium: 2.9 mEq/L — ABNORMAL LOW (ref 3.5–5.1)
Sodium: 141 mEq/L (ref 135–145)

## 2010-08-06 LAB — PROTIME-INR: Prothrombin Time: 11.5 s (ref 10.2–12.4)

## 2010-08-06 MED ORDER — FUROSEMIDE 40 MG PO TABS: 40.0000 mg | ORAL_TABLET | Freq: Every day | ORAL | Status: DC

## 2010-08-06 NOTE — Assessment & Plan Note (Signed)
The patient has reverted to VVI. This is likely response with her his symptoms with a pacemaker syndrome. We have discussed the benefits and risk of device generator replacement including but not limited to fracture and infection they understand these risks and are willing to proceed. Hopefully this will resolve his recent positive dyspnea and edema.

## 2010-08-06 NOTE — Progress Notes (Signed)
  HPI  Darin Thompson is a 77 y.o. 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 Over the last couple months he has had significant problems with shortness of breath fatigue and peripheral edema. It turns out his device reverted to ERI into a VVI mode about 3 months ago.  He's had no recurrent syncope.  Past Medical History  Diagnosis Date  . Sinoatrial node dysfunction   . Carotid sinus syndrome   . Dementia   . Gout   . HLD (hyperlipidemia)   . HTN (hypertension)     Past Surgical History  Procedure Date  . Pacemaker insertion 01/03/02    medtronic Kappa  . Inguinal hernia repair     right    Current Outpatient Prescriptions  Medication Sig Dispense Refill  . aspirin 81 MG tablet Take 81 mg by mouth daily.        Marland Kitchen atenolol (TENORMIN) 25 MG tablet Take 25 mg by mouth daily.        . colchicine 0.6 MG tablet Take 0.6 mg by mouth 2 (two) times daily.        Marland Kitchen donepezil (ARICEPT) 10 MG tablet Take 10 mg by mouth at bedtime as needed.        . hydrochlorothiazide 25 MG tablet Take 25 mg by mouth daily.        . memantine (NAMENDA) 10 MG tablet Take 10 mg by mouth 2 (two) times daily.        . simvastatin (ZOCOR) 40 MG tablet Take 20 mg by mouth at bedtime.        Marland Kitchen terazosin (HYTRIN) 10 MG capsule Take 10 mg by mouth at bedtime.        . furosemide (LASIX) 40 MG tablet Take 1 tablet (40 mg total) by mouth daily.  30 tablet  11    Allergies not on file  Review of Systems negative except from HPI and PMH  Physical Exam Well developed and well nourished in no acute distress HENT normal E scleral and icterus clear Neck Supple JVP 8-9cm ; carotids brisk and full Clear to ausculation Regular rate and rhythm, no murmurs gallops or rub Soft with active bowel sounds No clubbing cyanosis and edema Alert and oriented, grossly normal motor and sensory function Skin Warm and Dry     Assessment and  Plan

## 2010-08-06 NOTE — Patient Instructions (Signed)
Your physician recommends that you continue on your current medications as directed. Please refer to the Current Medication list given to you today.  Your physician recommends that you schedule a follow-up appointment in:  WILL SCHEDULE FROM HOSPITAL AFTER GEN CHANGE

## 2010-08-06 NOTE — Assessment & Plan Note (Signed)
No recurrent syncoep

## 2010-08-06 NOTE — Assessment & Plan Note (Signed)
stable °

## 2010-08-07 ENCOUNTER — Telehealth: Payer: Self-pay | Admitting: Internal Medicine

## 2010-08-07 NOTE — Telephone Encounter (Signed)
LMOM for call back. 

## 2010-08-08 NOTE — Telephone Encounter (Signed)
Left message for pt to call back in regards to 08/06/10 lab results.  Pt's potassium level 2.9.  Instructions on lab per Dr Graciela Husbands.

## 2010-08-11 ENCOUNTER — Other Ambulatory Visit: Payer: Self-pay | Admitting: *Deleted

## 2010-08-11 ENCOUNTER — Telehealth: Payer: Self-pay | Admitting: *Deleted

## 2010-08-11 DIAGNOSIS — E876 Hypokalemia: Secondary | ICD-10-CM

## 2010-08-11 MED ORDER — POTASSIUM CHLORIDE CRYS ER 20 MEQ PO TBCR: EXTENDED_RELEASE_TABLET | ORAL | Status: DC

## 2010-08-11 NOTE — Telephone Encounter (Signed)
Pt's daughter calling wanting to know father's lab results and what meds he is to take--advised please stop HCTZ and start KCL --RX e-scribed to [facility]--pt's daughter agrees--nt

## 2010-08-11 NOTE — Telephone Encounter (Signed)
Daughter calling wanting results of labs and what does dr Graciela Husbands want him to do--advised please stop HCTZ and start KCL 1 tab q.d.--i will e-mail RX to [facility]--pt 's daughter agrees--nt

## 2010-08-12 ENCOUNTER — Ambulatory Visit: Payer: Self-pay | Admitting: Internal Medicine

## 2010-08-12 MED ORDER — POTASSIUM CHLORIDE CRYS ER 20 MEQ PO TBCR: EXTENDED_RELEASE_TABLET | ORAL | Status: DC

## 2010-08-12 NOTE — Telephone Encounter (Signed)
SEE OTHER NOTES .Darin Thompson

## 2010-08-12 NOTE — Progress Notes (Signed)
Addended by: Scherrie Bateman on: 08/12/2010 02:17 PM   Modules accepted: Orders

## 2010-08-13 ENCOUNTER — Ambulatory Visit (HOSPITAL_COMMUNITY)
Admission: RE | Admit: 2010-08-13 | Discharge: 2010-08-13 | Disposition: A | Discharge status: Home / Self Care | Payer: Medicare Other | Source: Ambulatory Visit | Attending: Internal Medicine | Admitting: Internal Medicine

## 2010-08-13 ENCOUNTER — Ambulatory Visit (HOSPITAL_COMMUNITY): Payer: Medicare Other

## 2010-08-13 DIAGNOSIS — I495 Sick sinus syndrome: Secondary | ICD-10-CM | POA: Insufficient documentation

## 2010-08-13 DIAGNOSIS — Z45018 Encounter for adjustment and management of other part of cardiac pacemaker: Secondary | ICD-10-CM | POA: Insufficient documentation

## 2010-08-21 ENCOUNTER — Ambulatory Visit (INDEPENDENT_AMBULATORY_CARE_PROVIDER_SITE_OTHER): Payer: Medicare Other | Admitting: *Deleted

## 2010-08-21 DIAGNOSIS — Z950 Presence of cardiac pacemaker: Secondary | ICD-10-CM

## 2010-08-21 DIAGNOSIS — G9001 Carotid sinus syncope: Secondary | ICD-10-CM

## 2010-08-21 DIAGNOSIS — I459 Conduction disorder, unspecified: Secondary | ICD-10-CM

## 2010-08-28 ENCOUNTER — Telehealth: Payer: Self-pay | Admitting: Internal Medicine

## 2010-08-28 NOTE — Telephone Encounter (Signed)
Labs from 4/18 and 4/25 faxed, pt's daughter aware

## 2010-08-28 NOTE — Telephone Encounter (Signed)
Pt needs last set of lab results sent to Dr. Garner Nash his PCP fax# [phone] they need it by Monday

## 2010-09-02 NOTE — Cardiovascular Report (Signed)
[facility]                   [place] [facility]LECTROPHYSI[facility]Y DEVICE CLINIC NOTE   ROCKLIN, SODERQUIST                    MRN:          [id number]  DATE:01/31/2007                            DOB:          14-Jun-1926    SUBJECTIVE:  As mentioned, Mr. Wenke is seen.  He is status post  pacemaker implantation for syncope with carotid sinus hypersensitivity.  He has had no recurrent syncope.   MEDICATIONS:  His medications are reviewed.  They are notable for the  inter-current addition of Simvastatin and ranitidine.  He also takes  Aricept, trazodone and hydrochlorothiazide, Atenolol, Colchicine and  allopurinol.   PHYSICAL EXAMINATION:  VITAL SIGNS:  Today his blood pressure was  120/78, pulse 72.  LUNGS:  Clear.  HEART:  Sounds regular.  EXTREMITIES:  Without edema.   Interrogation of his pacemaker demonstrated no intrinsic atrial rhythm.  His impedance was 479 with a threshold of 0.75 at 0.4.  The A-wave  impedance of 550 and threshold of 2 volts at 0.4.  He is 98% atrial  paced.  Rate response is on and heart rate excursion is adequate.   IMPRESSION:  1. Carotid sinus hypersensitivity.  2. Syncope, secondary to number one.  3. Progressive sinus node dysfunction, now atrial pacer dependent.  4. Status post pacer for the above.   PLAN:  Mr. Vick is well.  Will plan to see him again in six months'  time in the Device Clinic.     Duke Salvia, MD, [facility]  Electronically Signed    SCK/MedQ  DD: 01/31/2007  DT: 02/01/2007  Job #: [id number]

## 2010-09-05 NOTE — Consult Note (Signed)
NAME:  Darin Thompson, Darin Thompson                       ACCOUNT NO.:  [id number]   MEDICAL RECORD NO.:  [id number]                   PATIENT TYPE:  INP   LOCATION:  2114                                 FACILITY:  [facility]   PHYSICIAN:  Duke Salvia, M.D. [facility]           DATE OF BIRTH:  [birth year]   DATE OF CONSULTATION:  DATE OF DISCHARGE:                                   CONSULTATION   REASON. FOR CONSULTATION:  I am privileged to see this patient in  electrophysiological consultation for recurrent syncope.   HISTORY OF PRESENT ILLNESS:  Notably the patient is a 77 year old man who  presented today for hernia surgery.  He had a 6.5 second pause and  significant sinus bradycardia.  He was transferred for further evaluation.   The patient has had a history of syncope that goes back a number of months.  Interestingly he denies syncope.  His daughter describes him as getting  significantly lightheaded, however, when he turns his head to the right.  He  denies lightheadedness with shaving or with wearing a tie, and he does not  look over his shoulder to drive backwards, rather he uses his mirror.  He  has had a number of significant falls and trauma.  He denies prior cardiac  history.  He has no family history for heart disease.  He does have  hypertension, but does not smoke or have diabetes.  His cholesterol status  is presumable abnormal as he is on Simvastatin.  He does not have tachy  palpitations, shortness of breath, chest pain, orthopnea, or nocturnal  dyspnea.   REVIEW OF SYSTEMS:  His review of systems is otherwise noncontributory.   MEDICATIONS:  Include hydrochlorothiazide 25, atenolol 25, allopurinol 300,  Tagamet,  __________ 1 h.s., colchicine 0.6 b.i.d., and Simvastatin 10 h.s.   SOCIAL HISTORY:  The patient is married.  He works as a Glass blower/designer.  He does not use alcohol or recreational drugs.   PHYSICAL EXAMINATION:   GENERAL:  On examination he is an elderly  Caucasian male looking somewhat  younger than his stated  age.   VITAL SIGNS:  His blood pressure is 144/72.  His heart rate is 45.   HEENT:  Exam demonstrates no scleral icterus.   NECK:  Neck veins are flat.  His carotids are not palpated because when I  put a stethoscope on his left neck his heart stopped for four seconds.   LUNGS:  Clear.   BACK:  Without kyphosis or scoliosis.   HEART:  Sounds are regular without murmurs or gallops.   ABDOMEN:  Soft with active bowel sounds, without midline pulsation or  hepatomegaly.  Femoral pulses are 2+.   EXTREMITIES:  Distal pulses are intact.  There is no clubbing, cyanosis or  edema.   NEUROLOGIC:  The neurological exam is grossly normal.   LABORATORY DATA:  Electrocardiogram from [facility] demonstrated  sinus rhythm at 37 with intervals of 0.17, 0.10, 0.47.  There were no other  significant ST-T changes and there were no significant Q waves, though there  were small Q waves in leads 1 and L.  His laboratories were notable for a  normal BMET, a normal hemogram.  His coags are not available.  Telemetry  strips from his long pause are not available.   IMPRESSION:  1. Recurrent syncope, likely due to a combination of:     a. sinus bradycardia; and,     b. Cardizem hypersensitivity.  2. No known cardiac history.  3. Hypertension and hypercholesterolemia.   The patient has recurrent syncope and clearly has bradycardia, and  significant pauses and likely has Cardizem hypersensitivity.  The issue on  the table, however, prior to device implantation is whether he has  significant LV dysfunction and may have occult coronary disease, and might  have ventricular tachycardia as a mechanism that needs attention.   He has had a 2-D echo obtained, we will review it prior to device  implantation.  I have reviewed the potential benefits as well as potential  risks of device implantation with the patient and the family, including,  but  not limited to 1 in 1,000 risk of death, 1 in 350 risk of cardiac  perforation, 1 in 100 risk of pneumothorax, lead dislodgement, infection,  and upper extremity thrombosis.  They understand these risks and are willing  to proceed.  We will plan to go in the morning.                                                   Duke Salvia, M.D. [facility]    SCK/MEDQ  D:  01/02/2002  T:  01/02/2002  Job:  [id number]   cc:   Donzetta Sprung  [address], [address]  [place]  [place] [address]  Fax: [phone]   Erskine Speed, M.D.   Willeen Niece, [facility]

## 2010-09-05 NOTE — Cardiovascular Report (Signed)
[facility]                     [place] [facility]ICE CLINIC NOTE   MAYA, SCHOLER                    MRN:          [id number]  DATE:12/04/2005                            DOB:          14-Mar-1927    Darin Thompson is seen.  He has a history of syncope with carotid sinus  hypersensitivity.  He is status post pacer in 2003.  He continues to do  well.  He works 8 to 10 hours a day.  He can still stand on his head.  He  has no chest discomfort or shortness of breath.   His medications are reviewed.  They are quite numerous but are unchanged.   PHYSICAL EXAMINATION:  VITAL SIGNS:  His blood pressure today was  unfortunately not recorded.  His pulse is 76.  LUNGS:  Clear.  HEART:  Regular.  EXTREMITIES:  Without edema.   Interrogation of his Medtronic ________ impulse generator demonstrates no  intrinsic atrial rhythm.  His impedence was 507 with threshold of 0.5 and  0.4.  The R-wave with _______ impedence of 557 and a threshold of 2 volts at  0.4.  He is ventricularly paced at 6% of the time.  Heart rate excursion was  adequate.   IMPRESSION:  1. Syncope with carotid sinus hypersensitivity.  2. Sinus node dysfunction ________chronotropic incompetence.  3. Status post pacer for the above.   Mr. Sasso is stable.  Will see him again in one year's time in the device  clinic.                                   Duke Salvia, MD, [facility]   SCK/MedQ  DD:  12/04/2005  DT:  12/04/2005  Job #:  [id number]   cc:   Donzetta Sprung

## 2010-09-05 NOTE — Discharge Summary (Signed)
NAME:  Darin Thompson, Darin Thompson                       ACCOUNT NO.:  [id number]   MEDICAL RECORD NO.:  [id number]                   PATIENT TYPE:  INP   LOCATION:  2114                                 FACILITY:  [facility]   PHYSICIAN:  Duke Salvia, M.D. [facility]           DATE OF BIRTH:  20-Jun-1926   DATE OF ADMISSION:  01/02/2002  DATE OF DISCHARGE:  01/04/2002                                 DISCHARGE SUMMARY   PRIMARY DIAGNOSIS:  Syncope with collapse.   HISTORY OF PRESENT ILLNESS:  This is a 77 year old gentleman with a history  of hypertension, history of presyncope over the last several months, reports  syncopal event two months ago while sitting in kitchen.  He suddenly felt  dizzy, passed out, and fell to the floor.  He did not seek medical attention  at that time.  Consult was requested with Dr. Ricke Hey preoperatively.  On  telemetry, the patient was found to have significant bradycardia with a  heart rate in the 30's, as well as prolonged sinus pause of 6.5 seconds.  Reportedly, the patient did not take his beta blocker that morning.   HOSPITAL COURSE:  The patient was admitted and underwent electrophysiology  consultation.  The patient had a placement of a Medtronic dual chamber  pacemaker on 01/03/02.  He tolerated the procedure well, and had no immediate  postoperative complications.  He was discharged to home on the following  medications:   DISCHARGE MEDICATIONS:  1. Hydrochlorothiazide 25 mg q.d.  2. Atenolol 25 mg q.d.  3. Allopurinol 300 mg q.d.  4. Hytrin 1 mg nightly.  5. Colchicine 0.6 mg b.i.d.  6. Zocor 10 mg nightly.  7. Tagamet 115 mg b.i.d.   ACTIVITY:  He is not to do any heavy lifting or strenuous activity with his  right arm for about four to six weeks.  Not to raise his right arm above his  head for one week, but gradually raise it as depicted on the discharge  summary.   DIET:  Low fat, low cholesterol, low salt diet.   WOUND CARE:  He is not to get his  wound wet for one week.   FOLLOWUP:  1. He was scheduled for an Adenosine Cardiolite stress test next week.  The     office will call him to set up the     appointment.  2. He was to follow up with Dr. Andee Lineman on 01/13/02, at 12:30 p.m.  3. Dr. Graciela Husbands in three months for a pacemaker check at the heart center in     [place].     Chinita Pester, C.R.N.P. [facility]                 Duke Salvia, M.D. [facility]    DS/MEDQ  D:  03/09/2002  T:  03/10/2002  Job:  [id number]   cc:   Duke Salvia, M.D. [facility]  Learta Codding, M.D. [facility]

## 2010-09-05 NOTE — Op Note (Signed)
NAME:  Darin Thompson, Darin Thompson                       ACCOUNT NO.:  [id number]   MEDICAL RECORD NO.:  [id number]                   PATIENT TYPE:  INP   LOCATION:  2114                                 FACILITY:  [facility]   PHYSICIAN:  Duke Salvia, M.D. [facility]           DATE OF BIRTH:  [birth year]   DATE OF PROCEDURE:  01/03/2002  DATE OF DISCHARGE:                                 OPERATIVE REPORT   PREOPERATIVE DIAGNOSIS:  Syncope with bradycardia and carotid  hypersensitivity.   POSTOPERATIVE DIAGNOSIS:  Syncope with bradycardia and carotid  hypersensitivity.   PROCEDURE:  Dual-chamber pacemaker implantation.   DESCRIPTION OF PROCEDURE:  Following the obtaining of informed consent, the  patient was brought to the electrophysiology laboratory and placed on the  fluoroscopic table in the supine position.  After routine prep and drape of  the right upper chest, intravenous contrast was injected via the right  antecubital vein to identify the course and the patency of the extrathoracic  right subclavian vein.  This having been accomplished, lidocaine was  infiltrated in the prepectoral subclavicular region, and an incision was  made and carried down to the layer of the prepectoral fascia using sharp  dissection and electrocautery.  A pocket was formed similarly.  Hemostasis  was obtained.   Thereafter attention was turned to gaining access to the extrathoracic right  subclavian vein, which was accomplished without difficulty without the  aspiration of air or puncture of the artery.  Two venipunctures were  accomplished, two guidewires were placed and retained, and a 0 silk suture  was placed in a figure-of-eight fashion and allowed to hang loosely.   Subsequently 7 [place] and 8 [place] tear-away introducer sheaths were placed,  through which were passed a Medtronic 5076 52-cm active-fixation ventricular  lead, serial number [id number], and a Medtronic 5076 active-fixation atrial  lead, 45  cm length, serial number [id number].  Under fluoroscopic guidance  these were manipulated to the right ventricular apex and the right atrial  free wall, respectively, where the bipolar R-wave was 10.5 millivolts with a  pacing impedance of 1288 Ohms, a pacing threshold of 0.8 volts at 0.5 msec,  currented threshold was 0.8 MA, and there was no diaphragmatic pacing at 10  volts.   The bipolar P-wave was 4.2 millivolts with a pacing impedance of 871 Ohms,  pacing threshold immediately after screw deployment of 1.4 volts at 0.5  msec, currented threshold was 2.1 MA, and again there was no diaphragmatic  pacing at 10 volts.   With these acceptable parameters recorded, the leads were secured to the  prepectoral fascia and the hemostasis suture was secured.  The leads were  then attached to a Medtronic Kappa [id number] pulse generator, serial number  [id number].  Ventricular pacing and then AV pacing were identified.  The  pocket was copiously irrigated with  antibiotic-containing saline solution.  The leads and the pulse generator  were then placed in  the pocket, secured to the prepectoral fascia.  The  wound was then closed with two suture layers and superficial Dermabond.   The patient tolerated the procedure well without apparent complications.                                                Duke Salvia, M.D. [facility]    SCK/MEDQ  D:  01/03/2002  T:  01/03/2002  Job:  [id number]   cc:   Donzetta Sprung  [address], [address]  [place]  [place] [address]  Fax: [phone]   Willeen Niece, [place]   Electrophysiology Laboratory   [facility]

## 2010-09-09 NOTE — Op Note (Signed)
  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  PROCEDURES: 1. Explantation of a previously implanted device. 2. Pocket revision. 3. Insertion of a new device.  Following obtaining informed consent, the patient was brought to the electrophysiology laboratory and placed on the fluoroscopic table in supine position.  After routine prep and drape of the right upper chest, lidocaine was infiltrated along the line of the previous incision and carried down towards the layer of the device pocket using sharp dissection and electrocautery.  The pocket was opened up.  First, I noted that one of the leads was on top of the can.  The device was freed up from its retaining suture which was removed.  The device was explanted.  The atrial lead was then separated, attached to the PSA, and we then proceeded to work on dissecting both leads from the floor of the pocket.  This resulted in removal of significant amount of scar tissue. It took about 30 minutes.  There is no evidence of damage to the leads.  Interrogation of the previously implanted atrial lead demonstrated Medtronic [id number], serial [id number] and a 5076, 52-cm passive fixation lead, serial #[id number].  The atrial amplitude was nondetectable because of device dependence and the atrium  impedance was 436 and threshold of 1 volt at 0.5.  The R-wave was 40 with a pace impedance of 532 with threshold of 1.8 at 0.5.  The leads were then attached to a Medtronic Adapta L pulse generator, serial #[id number] and atrial pacing was identified.  The pocket was copiously irrigated with antibiotic- containing saline solution.  Hemostasis was assured.  The leads and pulse generator were placed in the pocket and secured to the prepectoral fascia.  The wound was then closed in 3 layers in the normal fashion. The wound was washed and dried and a benzoin and Steri-Strip dressing was applied.  Needle counts, sponge counts, and instrument counts were correct at the end of the procedure according to staff.  The patient tolerated the procedure without apparent complication.     Duke Salvia, MD, [facility]     SCK/MEDQ  D:  08/13/2010  T:  08/14/2010  Job:  [id number]  Electronically Signed by Sherryl Manges MD [facility] on 09/09/2010 04:24:12 PM

## 2010-12-03 ENCOUNTER — Encounter: Payer: Self-pay | Admitting: Internal Medicine

## 2010-12-03 ENCOUNTER — Ambulatory Visit (INDEPENDENT_AMBULATORY_CARE_PROVIDER_SITE_OTHER): Payer: Medicare Other | Admitting: Internal Medicine

## 2010-12-03 DIAGNOSIS — Z950 Presence of cardiac pacemaker: Secondary | ICD-10-CM

## 2010-12-03 DIAGNOSIS — R001 Bradycardia, unspecified: Secondary | ICD-10-CM | POA: Insufficient documentation

## 2010-12-03 DIAGNOSIS — G9009 Other idiopathic peripheral autonomic neuropathy: Secondary | ICD-10-CM

## 2010-12-03 DIAGNOSIS — I498 Other specified cardiac arrhythmias: Secondary | ICD-10-CM

## 2010-12-03 DIAGNOSIS — G9001 Carotid sinus syncope: Secondary | ICD-10-CM

## 2010-12-03 NOTE — Assessment & Plan Note (Signed)
No recurrent syncope 

## 2010-12-03 NOTE — Assessment & Plan Note (Signed)
100% atrially paced without intrinsic sinus activity

## 2010-12-03 NOTE — Patient Instructions (Signed)
Your physician wants you to follow-up in: April 2013. You will receive a reminder letter in the mail two months in advance. If you don't receive a letter, please call our office to schedule the follow-up appointment.  Your physician recommends that you continue on your current medications as directed. Please refer to the Current Medication list given to you today.  

## 2010-12-03 NOTE — Progress Notes (Signed)
  HPI  Darin Thompson is a 77 y.o. male Seen in followup for pacer implanted for carotid sinus hypersensitivity for which he underwent device generator replacement a few months ago The patient denies SOB, chest pain edema or palpitations.  There has been no syncope or presyncope. nEnergy much improved since pacemaker generatr replacement   He's had no recurrent syncope.  Past Medical History  Diagnosis Date  . Sinoatrial node dysfunction   . Carotid sinus syndrome   . Dementia   . Gout   . HLD (hyperlipidemia)   . HTN (hypertension)     Past Surgical History  Procedure Date  . Pacemaker insertion 01/03/02    medtronic Kappa  . Inguinal hernia repair     right    Current Outpatient Prescriptions  Medication Sig Dispense Refill  . aspirin 81 MG tablet Take 81 mg by mouth daily.        Marland Kitchen atenolol (TENORMIN) 25 MG tablet Take 25 mg by mouth daily.        . colchicine 0.6 MG tablet Take 0.6 mg by mouth 2 (two) times daily.        Marland Kitchen donepezil (ARICEPT) 10 MG tablet Take 10 mg by mouth at bedtime as needed.        . hydrochlorothiazide 25 MG tablet Take 25 mg by mouth daily.        . memantine (NAMENDA) 10 MG tablet Take 10 mg by mouth 2 (two) times daily.        . simvastatin (ZOCOR) 40 MG tablet Take 20 mg by mouth at bedtime.        Marland Kitchen terazosin (HYTRIN) 10 MG capsule Take 10 mg by mouth at bedtime.        . furosemide (LASIX) 40 MG tablet Take 1 tablet (40 mg total) by mouth daily.  30 tablet  11    Allergies not on file  Review of Systems negative except from HPI and PMH  Physical Exam Well developed and well nourished in no acute distress HENT normal E scleral and icterus clear Neck Supple JVP nl ; carotids brisk and full Clear to ausculation devcie pocket well healed Regular rate and rhythm, no murmurs gallops or rub Soft with active bowel sounds No clubbing cyanosis and edema Alert and oriented, grossly normal motor and sensory function Skin Warm and Dry       Assessment and  Plan

## 2010-12-03 NOTE — Assessment & Plan Note (Signed)
The patient's device was interrogated.  The information was reviewed. No changes were made in the programming.    

## 2011-04-23 ENCOUNTER — Encounter: Payer: Self-pay | Admitting: Internal Medicine

## 2011-09-03 ENCOUNTER — Encounter: Payer: Self-pay | Admitting: Internal Medicine

## 2011-09-03 ENCOUNTER — Ambulatory Visit (INDEPENDENT_AMBULATORY_CARE_PROVIDER_SITE_OTHER): Payer: Medicare Other | Admitting: Internal Medicine

## 2011-09-03 VITALS — BP 118/82 | HR 65 | Ht 71.0 in | Wt 191.0 lb

## 2011-09-03 DIAGNOSIS — F0390 Unspecified dementia without behavioral disturbance: Secondary | ICD-10-CM

## 2011-09-03 DIAGNOSIS — Z950 Presence of cardiac pacemaker: Secondary | ICD-10-CM

## 2011-09-03 DIAGNOSIS — R55 Syncope and collapse: Secondary | ICD-10-CM

## 2011-09-03 DIAGNOSIS — I498 Other specified cardiac arrhythmias: Secondary | ICD-10-CM

## 2011-09-03 DIAGNOSIS — R001 Bradycardia, unspecified: Secondary | ICD-10-CM

## 2011-09-03 DIAGNOSIS — G9001 Carotid sinus syncope: Secondary | ICD-10-CM

## 2011-09-03 LAB — PACEMAKER DEVICE OBSERVATION
ATRIAL PACING PM: 98
BAMS-0001: 150 {beats}/min
RV LEAD IMPEDENCE PM: 565 Ohm
RV LEAD THRESHOLD: 1.5 V

## 2011-09-03 NOTE — Assessment & Plan Note (Signed)
No recurrent syncope 

## 2011-09-03 NOTE — Patient Instructions (Signed)
Remote monitoring is used to monitor your Pacemaker of ICD from home. This monitoring reduces the number of office visits required to check your device to one time per year. It allows [place] to keep an eye on the functioning of your device to ensure it is working properly. You are scheduled for a device check from home on 12/10/11. You may send your transmission at any time that day. If you have a wireless device, the transmission will be sent automatically. After your physician reviews your transmission, you will receive a postcard with your next transmission date.   Your physician wants you to follow-up in: 1 year with Dr. Graciela Husbands. You will receive a reminder letter in the mail two months in advance. If you don't receive a letter, please call our office to schedule the follow-up appointment.  Your physician recommends that you continue on your current medications as directed. Please refer to the Current Medication list given to you today.

## 2011-09-03 NOTE — Assessment & Plan Note (Signed)
The patient's device was interrogated.  The information was reviewed. No changes were made in the programming.    

## 2011-09-03 NOTE — Assessment & Plan Note (Signed)
98% atrial paced

## 2011-09-03 NOTE — Progress Notes (Signed)
  HPI  Darin Thompson is a 78 y.o. male Seen in followup for pacer implanted for carotid sinus hypersensitivity for which he underwent device generator replacement a few months ago  The patient denies SOB, chest pain edema or palpitations. There has been no syncope or presyncope. Energy much improved since pacemaker generatr replacement  Dementia has been accelerating lately but the family says they are coping adequately   Past Medical History  Diagnosis Date  . Sinoatrial node dysfunction   . Carotid sinus syndrome   . Dementia   . Gout   . HLD (hyperlipidemia)   . HTN (hypertension)     Past Surgical History  Procedure Date  . Pacemaker insertion 01/03/02    medtronic Kappa  . Inguinal hernia repair     right    Current Outpatient Prescriptions  Medication Sig Dispense Refill  . allopurinol (ZYLOPRIM) 300 MG tablet Take 300 mg by mouth daily.      Marland Kitchen aspirin 81 MG tablet Take 81 mg by mouth daily.        Marland Kitchen atenolol (TENORMIN) 25 MG tablet Take 25 mg by mouth daily.        . colchicine 0.6 MG tablet Take 0.6 mg by mouth 2 (two) times daily.        . divalproex (DEPAKOTE ER) 500 MG 24 hr tablet Take 500 mg by mouth at bedtime.      . donepezil (ARICEPT) 10 MG tablet Take 10 mg by mouth at bedtime as needed.        . memantine (NAMENDA) 10 MG tablet Take 10 mg by mouth 2 (two) times daily.        . simvastatin (ZOCOR) 40 MG tablet Take 20 mg by mouth at bedtime.        Marland Kitchen terazosin (HYTRIN) 10 MG capsule Take 10 mg by mouth at bedtime.          No Known Allergies  Review of Systems negative except from HPI and PMH  Physical Exam BP 118/82  Pulse 65  Ht 5\' 11"  (1.803 m)  Wt 191 lb (86.637 kg)  BMI 26.64 kg/m2 Well developed and well nourished in no acute distress HENT normal E scleral and icterus clear Neck Supple JVP flat; carotids brisk and full Clear to ausculation Regular rate and rhythm, no murmurs gallops or rub Soft with active bowel sounds No clubbing  cyanosis none Edema Alert but not oriented, grossly normal motor and sensory function Skin Warm and Dry  ECG: Atrial paced Rhythm  @ 65            Intervals  28/10/38  Axis 160  irbbb/RVH    Assessment and  Plan

## 2011-09-03 NOTE — Assessment & Plan Note (Signed)
A majr challenge but the family seems to be coping adequately per their report

## 2011-09-03 NOTE — Assessment & Plan Note (Signed)
As above.

## 2011-12-10 ENCOUNTER — Encounter: Payer: Medicare Other | Admitting: *Deleted

## 2011-12-15 ENCOUNTER — Encounter: Payer: Self-pay | Admitting: *Deleted

## 2011-12-29 ENCOUNTER — Ambulatory Visit (INDEPENDENT_AMBULATORY_CARE_PROVIDER_SITE_OTHER): Payer: Medicare Other | Admitting: *Deleted

## 2011-12-29 DIAGNOSIS — Z950 Presence of cardiac pacemaker: Secondary | ICD-10-CM

## 2011-12-29 DIAGNOSIS — G9001 Carotid sinus syncope: Secondary | ICD-10-CM

## 2011-12-31 LAB — REMOTE PACEMAKER DEVICE
AL IMPEDENCE PM: 486 Ohm
ATRIAL PACING PM: 99
BATTERY VOLTAGE: 2.79 V
RV LEAD IMPEDENCE PM: 546 Ohm
RV LEAD THRESHOLD: 1.625 V
VENTRICULAR PACING PM: 0

## 2012-01-27 ENCOUNTER — Encounter: Payer: Self-pay | Admitting: *Deleted

## 2012-02-02 ENCOUNTER — Encounter: Payer: Self-pay | Admitting: Internal Medicine

## 2012-04-04 ENCOUNTER — Encounter: Payer: Medicare Other | Admitting: *Deleted

## 2012-04-18 ENCOUNTER — Encounter: Payer: Self-pay | Admitting: *Deleted

## 2012-04-26 ENCOUNTER — Ambulatory Visit (INDEPENDENT_AMBULATORY_CARE_PROVIDER_SITE_OTHER): Payer: Medicare Other | Admitting: *Deleted

## 2012-04-26 DIAGNOSIS — G9001 Carotid sinus syncope: Secondary | ICD-10-CM

## 2012-04-26 DIAGNOSIS — Z950 Presence of cardiac pacemaker: Secondary | ICD-10-CM

## 2012-04-28 LAB — REMOTE PACEMAKER DEVICE
AL IMPEDENCE PM: 493 Ohm
AL THRESHOLD: 0.75 V
BATTERY VOLTAGE: 2.78 V
RV LEAD IMPEDENCE PM: 555 Ohm
RV LEAD THRESHOLD: 1.625 V

## 2012-05-05 IMAGING — CR DG CHEST 2V
2 series · 2 of 2 positions shown · non-contrast
Comparison: None.

CLINICAL DATA: Preop for cath lab/hypertension.  No current chest
complaints

CHEST - 2 VIEW

[view not recorded (1 of 2)]
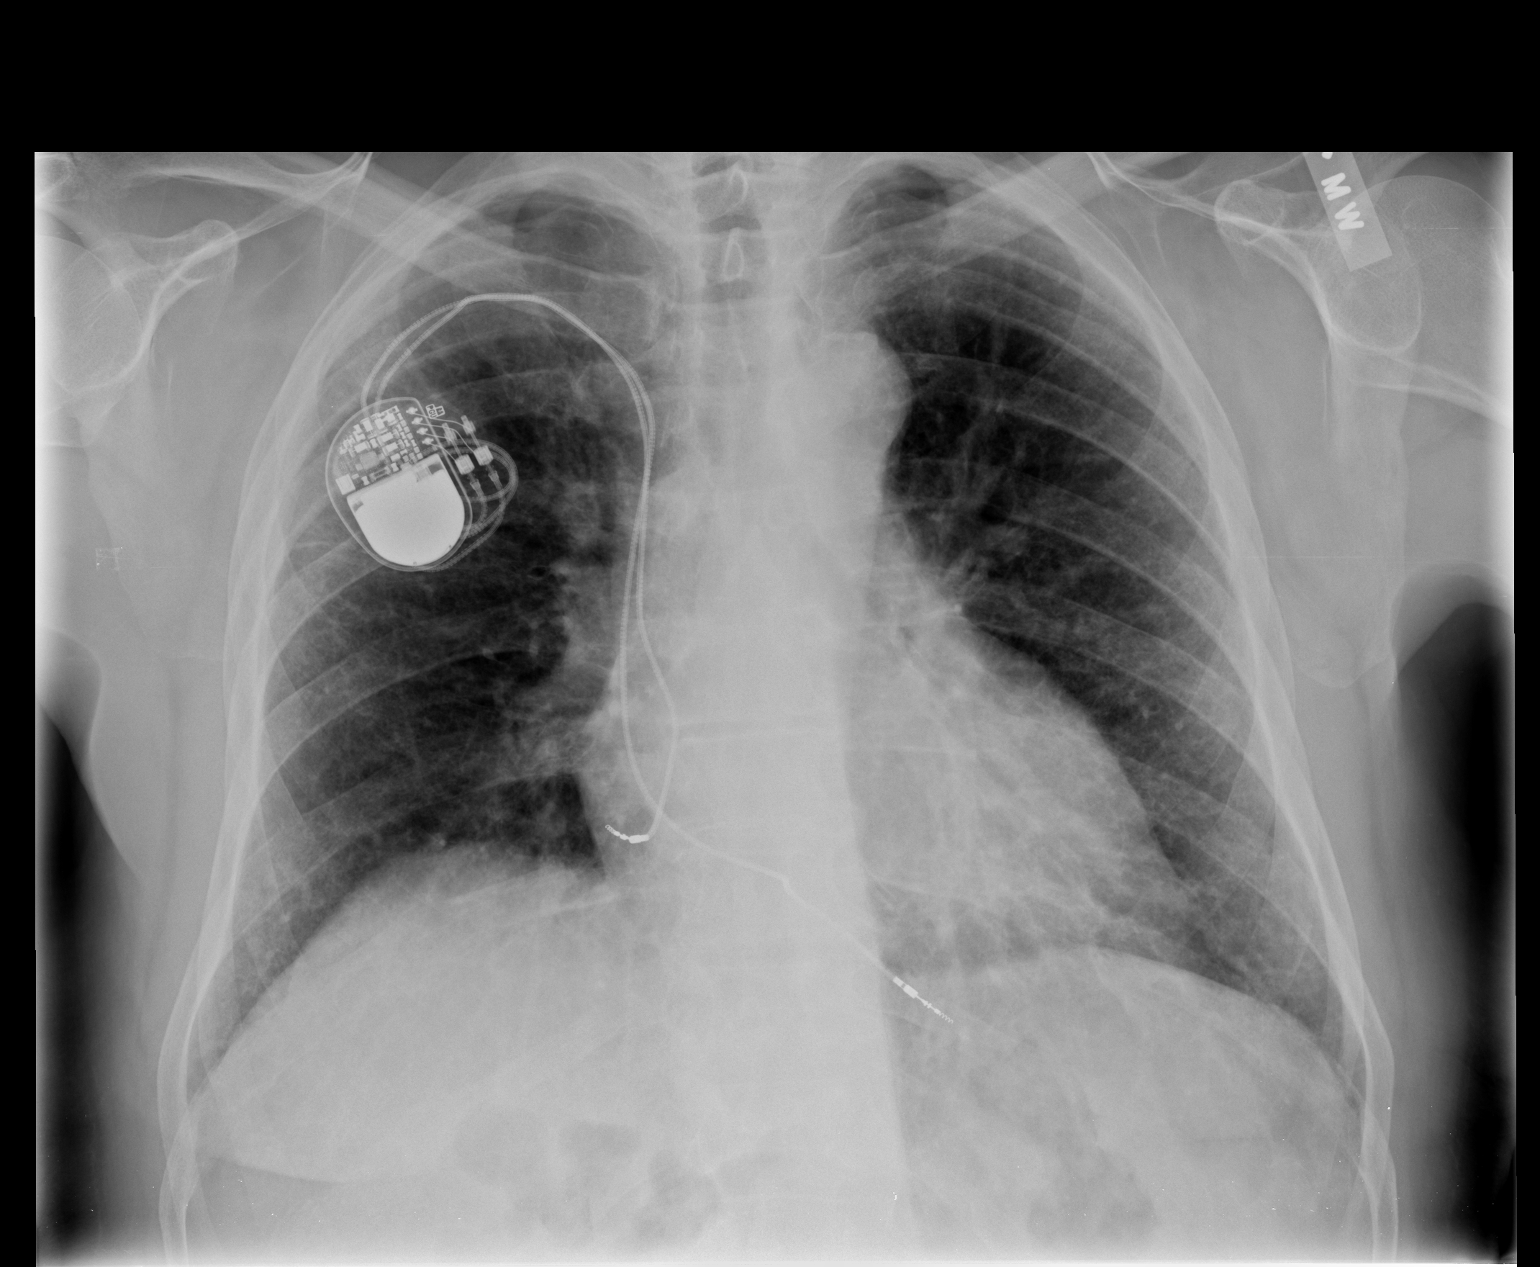

[view not recorded (2 of 2)]
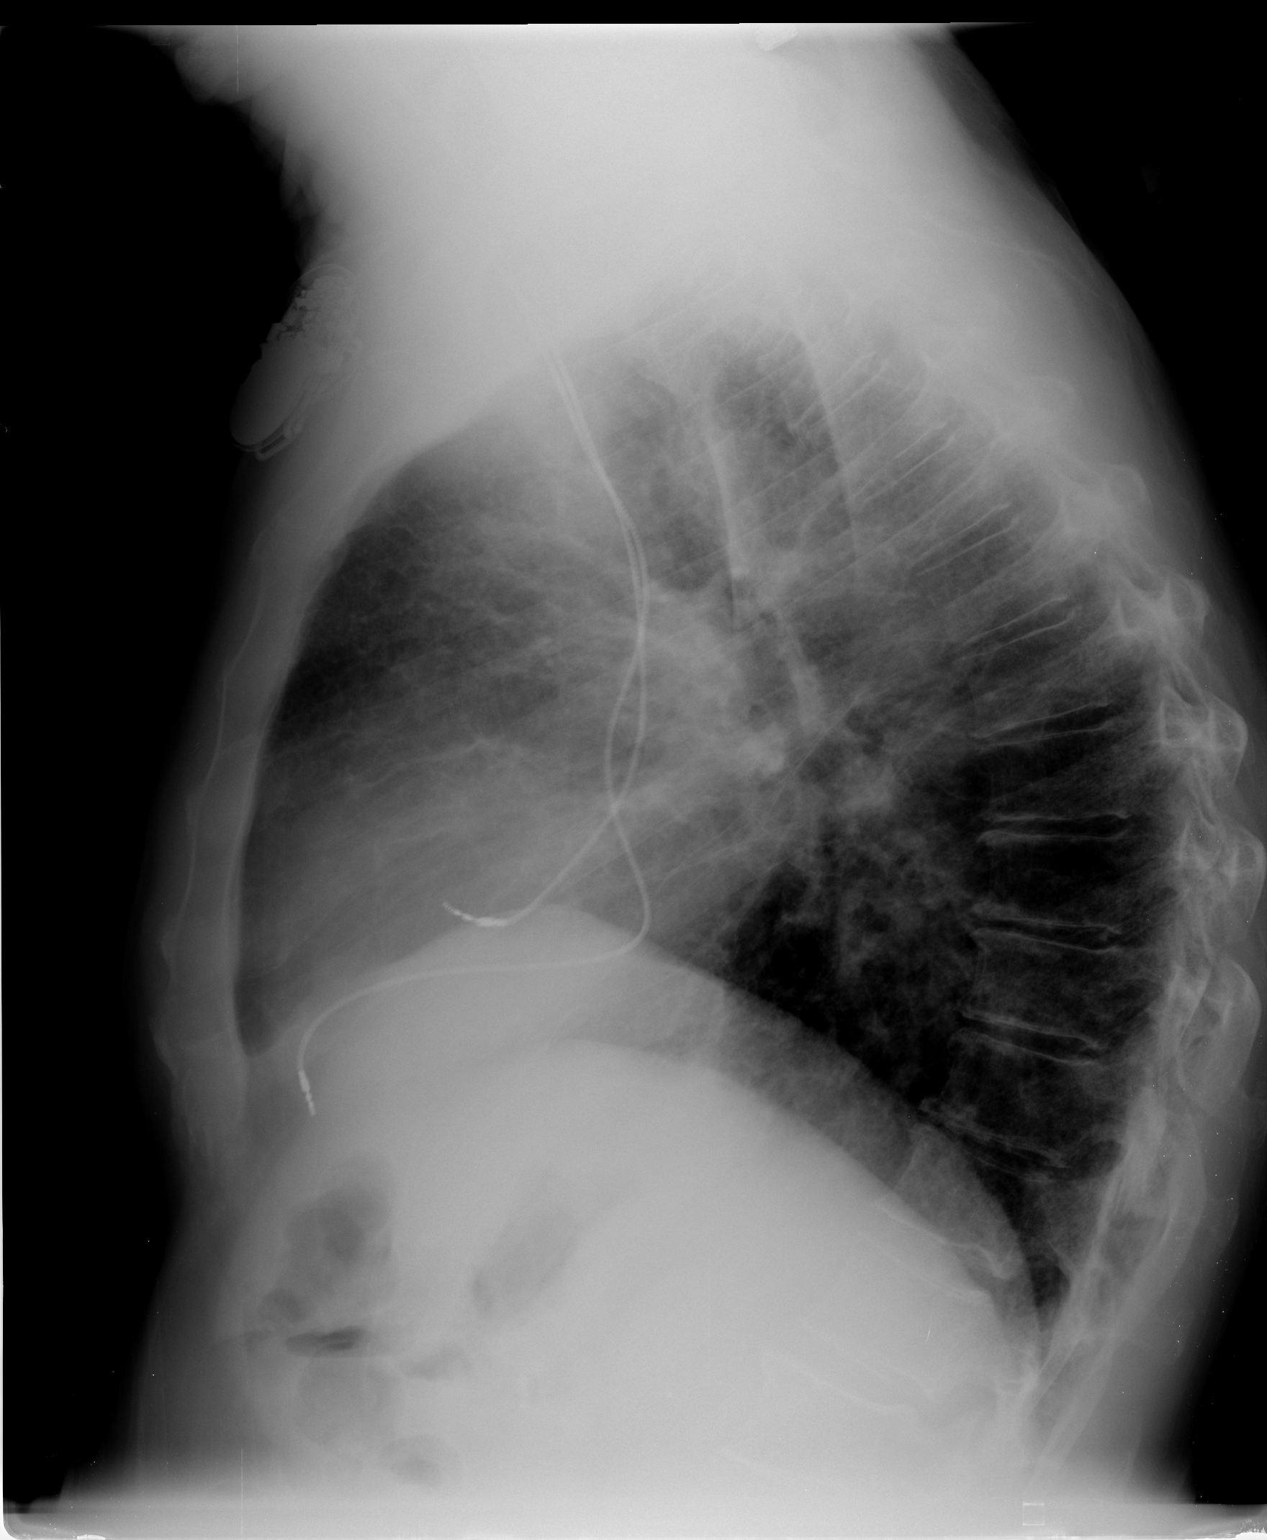

[2 of 2 positions shown; findings below may reference images not displayed]

FINDINGS: Heart size upper normal.  Prominent lung markings and
mild central airway thickening.  No active airspace disease.  No
vascular congestion or pleural fluid.  A dual lead permanent
cardiac pacer is in place with leads in the right atrium and right
ventricle.
IMPRESSION: Chronic lung changes and permanent cardiac pacer - no acute
findings.

## 2012-05-20 ENCOUNTER — Encounter: Payer: Self-pay | Admitting: Internal Medicine

## 2012-07-25 ENCOUNTER — Encounter: Payer: Medicare Other | Admitting: *Deleted

## 2012-08-01 ENCOUNTER — Encounter: Payer: Self-pay | Admitting: *Deleted

## 2012-11-15 ENCOUNTER — Encounter: Payer: Self-pay | Admitting: *Deleted

## 2013-01-23 ENCOUNTER — Telehealth: Payer: Self-pay | Admitting: Internal Medicine

## 2013-01-23 ENCOUNTER — Encounter: Payer: Self-pay | Admitting: Internal Medicine

## 2013-01-23 NOTE — Telephone Encounter (Signed)
01-23-13 sent past due letter, missed remote check 07-25-12/mt

## 2013-09-18 ENCOUNTER — Encounter: Payer: Self-pay | Admitting: Cardiology

## 2013-09-20 ENCOUNTER — Telehealth: Payer: Self-pay | Admitting: Internal Medicine

## 2013-09-20 NOTE — Telephone Encounter (Signed)
09-20-13 sent certified letter re past due device ck/mt ° °

## 2013-09-29 NOTE — Telephone Encounter (Signed)
09-29-13 CERTIFIED LETTER SIGNED BY CASSANDRA Paisley 09-25-13/MT

## 2013-11-14 ENCOUNTER — Encounter: Payer: Self-pay | Admitting: Internal Medicine

## 2013-11-14 ENCOUNTER — Ambulatory Visit (INDEPENDENT_AMBULATORY_CARE_PROVIDER_SITE_OTHER): Payer: Medicare Other | Admitting: Internal Medicine

## 2013-11-14 VITALS — BP 110/0 | HR 67 | Ht 70.0 in

## 2013-11-14 DIAGNOSIS — G9001 Carotid sinus syncope: Secondary | ICD-10-CM

## 2013-11-14 DIAGNOSIS — Z950 Presence of cardiac pacemaker: Secondary | ICD-10-CM

## 2013-11-14 LAB — MDC_IDC_ENUM_SESS_TYPE_INCLINIC
Battery Voltage: 2.79 V
Brady Statistic AP VS Percent: 96 %
Brady Statistic AS VP Percent: 0 %
Lead Channel Pacing Threshold Amplitude: 0.5 V
Lead Channel Pacing Threshold Amplitude: 1 V
Lead Channel Pacing Threshold Pulse Width: 0.4 ms
Lead Channel Pacing Threshold Pulse Width: 0.4 ms
Lead Channel Sensing Intrinsic Amplitude: 11.2 mV
Lead Channel Setting Pacing Amplitude: 2.5 V
Lead Channel Setting Pacing Pulse Width: 0.4 ms
Lead Channel Setting Sensing Sensitivity: 5.6 mV
MDC IDC MSMT BATTERY IMPEDANCE: 185 Ohm
MDC IDC MSMT BATTERY REMAINING LONGEVITY: 125 mo
MDC IDC MSMT LEADCHNL RA IMPEDANCE VALUE: 486 Ohm
MDC IDC MSMT LEADCHNL RA SENSING INTR AMPL: 4 mV
MDC IDC MSMT LEADCHNL RV IMPEDANCE VALUE: 584 Ohm
MDC IDC SESS DTM: 20150728122941
MDC IDC SET LEADCHNL RA PACING AMPLITUDE: 2 V
MDC IDC STAT BRADY AP VP PERCENT: 2 %
MDC IDC STAT BRADY AS VS PERCENT: 1 %

## 2013-11-14 NOTE — Patient Instructions (Signed)
Your physician recommends that you continue on your current medications as directed. Please refer to the Current Medication list given to you today.  No follow up with Dr. Graciela HusbandsKlein (after family meeting)

## 2013-11-14 NOTE — Progress Notes (Signed)
      Patient has no care team.   HPI  Darin Thompson is a 80 y.o. male Seen in followup for pacer implanted for carotid sinus hypersensitivity for which he underwent device generator replacement 2012   He is recently been made DO NOT RESUSCITATE   Does not communicate regarding complaints   Past Medical History  Diagnosis Date  . Sinoatrial node dysfunction   . Carotid sinus syndrome   . Dementia   . Gout   . HLD (hyperlipidemia)   . HTN (hypertension)   . Pacemaker-medtronic     Past Surgical History  Procedure Laterality Date  . Pacemaker insertion  01/03/02    medtronic Kappa  . Inguinal hernia repair      right    Current Outpatient Prescriptions  Medication Sig Dispense Refill  . allopurinol (ZYLOPRIM) 300 MG tablet Take 300 mg by mouth daily.      Darin Kitchen. ALPRAZolam (XANAX) 0.25 MG tablet Take 0.25 mg by mouth 4 (four) times daily.      Darin Kitchen. terazosin (HYTRIN) 10 MG capsule Take 10 mg by mouth at bedtime.         No current facility-administered medications for this visit.    No Known Allergies  Review of Systems negative except from HPI and PMH  Physical Exam BP 110/Palp Pulse 67  Ht 5\' 10"  (1.778 m) Well developed and well nourished in no acute distress HENT normal E scleral and icterus clear Neck Supple JVP flat; carotids brisk and full Clear to ausculation Device pocket well healed; without hematoma or erythema.  There is no tethering\Regular rate and rhythm, no murmurs gallops or rub Soft with active bowel sounds No clubbing cyanosis  Edema Alert and oriented, grossly normal motor and sensory function Skin Warm and Dry    Assessment and  Plan  Sinus node dysfunction   Pacemaker-Medtronic   The patient's device is functioning normally. He is quite cumbersome for them to get here. He atrially paced about 80% of the time. I have mentioned to his daughter, who is a Engineer, civil (consulting)nurse, the pacemaker was functioning normally the heart rate is about 50. They  will calll As needed.

## 2013-11-17 ENCOUNTER — Encounter: Payer: Self-pay | Admitting: Internal Medicine

## 2014-05-21 DEATH — deceased
